# Patient Record
Sex: Female | Born: 1965 | Race: White | Hispanic: Yes | Marital: Married | Smoking: Former smoker
Health system: Southern US, Community
[De-identification: ages and names within clinical notes are randomized; demographics above are authoritative.]

## PROBLEM LIST (undated history)

## (undated) DIAGNOSIS — E669 Obesity, unspecified: Secondary | ICD-10-CM

## (undated) HISTORY — DX: Obesity, unspecified: E66.9

---

## 2005-09-01 ENCOUNTER — Other Ambulatory Visit: Admission: RE | Admit: 2005-09-01 | Discharge: 2005-09-01 | Payer: Self-pay | Admitting: Obstetrics and Gynecology

## 2007-03-02 ENCOUNTER — Emergency Department (HOSPITAL_COMMUNITY): Admission: EM | Admit: 2007-03-02 | Discharge: 2007-03-02 | Payer: Self-pay | Admitting: Emergency Medicine

## 2007-03-05 ENCOUNTER — Emergency Department (HOSPITAL_COMMUNITY): Admission: EM | Admit: 2007-03-05 | Discharge: 2007-03-05 | Payer: Self-pay | Admitting: Emergency Medicine

## 2007-03-09 ENCOUNTER — Encounter (HOSPITAL_COMMUNITY): Admission: RE | Admit: 2007-03-09 | Discharge: 2007-06-07 | Payer: Self-pay | Admitting: Emergency Medicine

## 2009-10-06 ENCOUNTER — Encounter: Admission: RE | Admit: 2009-10-06 | Discharge: 2009-10-06 | Payer: Self-pay | Admitting: Obstetrics and Gynecology

## 2009-12-24 ENCOUNTER — Encounter: Admission: RE | Admit: 2009-12-24 | Discharge: 2009-12-24 | Payer: Self-pay | Admitting: Obstetrics and Gynecology

## 2010-04-29 ENCOUNTER — Encounter: Admission: RE | Admit: 2010-04-29 | Discharge: 2010-04-29 | Payer: Self-pay | Admitting: Family Medicine

## 2010-08-02 ENCOUNTER — Encounter: Payer: Self-pay | Admitting: Obstetrics and Gynecology

## 2011-01-01 ENCOUNTER — Other Ambulatory Visit: Payer: Self-pay | Admitting: Obstetrics and Gynecology

## 2011-01-06 ENCOUNTER — Other Ambulatory Visit: Payer: Self-pay | Admitting: Obstetrics and Gynecology

## 2011-01-06 DIAGNOSIS — R928 Other abnormal and inconclusive findings on diagnostic imaging of breast: Secondary | ICD-10-CM

## 2011-01-12 ENCOUNTER — Ambulatory Visit
Admission: RE | Admit: 2011-01-12 | Discharge: 2011-01-12 | Disposition: A | Discharge status: Home / Self Care | Payer: BC Managed Care – PPO | Source: Ambulatory Visit | Attending: Obstetrics and Gynecology | Admitting: Obstetrics and Gynecology

## 2011-01-12 DIAGNOSIS — R928 Other abnormal and inconclusive findings on diagnostic imaging of breast: Secondary | ICD-10-CM

## 2014-06-03 ENCOUNTER — Other Ambulatory Visit: Payer: Self-pay | Admitting: Family Medicine

## 2014-06-03 DIAGNOSIS — N644 Mastodynia: Secondary | ICD-10-CM

## 2014-06-19 ENCOUNTER — Ambulatory Visit
Admission: RE | Admit: 2014-06-19 | Discharge: 2014-06-19 | Disposition: A | Discharge status: Home / Self Care | Payer: BC Managed Care – PPO | Source: Ambulatory Visit | Attending: Family Medicine | Admitting: Family Medicine

## 2014-06-19 DIAGNOSIS — N644 Mastodynia: Secondary | ICD-10-CM

## 2014-07-20 ENCOUNTER — Emergency Department (HOSPITAL_COMMUNITY): Payer: BLUE CROSS/BLUE SHIELD

## 2014-07-20 ENCOUNTER — Emergency Department (HOSPITAL_COMMUNITY)
Admission: EM | Admit: 2014-07-20 | Discharge: 2014-07-20 | Disposition: A | Discharge status: Home / Self Care | Payer: BLUE CROSS/BLUE SHIELD | Attending: Emergency Medicine | Admitting: Emergency Medicine

## 2014-07-20 ENCOUNTER — Encounter (HOSPITAL_COMMUNITY): Payer: Self-pay | Admitting: Emergency Medicine

## 2014-07-20 DIAGNOSIS — Z3202 Encounter for pregnancy test, result negative: Secondary | ICD-10-CM | POA: Diagnosis not present

## 2014-07-20 DIAGNOSIS — Y998 Other external cause status: Secondary | ICD-10-CM | POA: Insufficient documentation

## 2014-07-20 DIAGNOSIS — Y9301 Activity, walking, marching and hiking: Secondary | ICD-10-CM | POA: Diagnosis not present

## 2014-07-20 DIAGNOSIS — S79912A Unspecified injury of left hip, initial encounter: Secondary | ICD-10-CM | POA: Insufficient documentation

## 2014-07-20 DIAGNOSIS — S6992XA Unspecified injury of left wrist, hand and finger(s), initial encounter: Secondary | ICD-10-CM | POA: Diagnosis not present

## 2014-07-20 DIAGNOSIS — Z87891 Personal history of nicotine dependence: Secondary | ICD-10-CM | POA: Diagnosis not present

## 2014-07-20 DIAGNOSIS — W010XXA Fall on same level from slipping, tripping and stumbling without subsequent striking against object, initial encounter: Secondary | ICD-10-CM | POA: Diagnosis not present

## 2014-07-20 DIAGNOSIS — Z79899 Other long term (current) drug therapy: Secondary | ICD-10-CM | POA: Insufficient documentation

## 2014-07-20 DIAGNOSIS — Y9259 Other trade areas as the place of occurrence of the external cause: Secondary | ICD-10-CM | POA: Diagnosis not present

## 2014-07-20 DIAGNOSIS — Z880 Allergy status to penicillin: Secondary | ICD-10-CM | POA: Diagnosis not present

## 2014-07-20 DIAGNOSIS — W19XXXA Unspecified fall, initial encounter: Secondary | ICD-10-CM

## 2014-07-20 LAB — POC URINE PREG, ED: PREG TEST UR: NEGATIVE

## 2014-07-20 MED ORDER — IBUPROFEN 800 MG PO TABS: 800.0000 mg | ORAL_TABLET | Freq: Three times a day (TID) | ORAL | Status: AC

## 2014-07-20 MED ORDER — HYDROCODONE-ACETAMINOPHEN 5-325 MG PO TABS: 1.0000 | ORAL_TABLET | Freq: Four times a day (QID) | ORAL | Status: DC | PRN

## 2014-07-20 MED ORDER — HYDROMORPHONE HCL 1 MG/ML IJ SOLN
1.0000 mg | Freq: Once | INTRAMUSCULAR | Status: AC
  Administered 2014-07-20: 1 mg via INTRAMUSCULAR
  Filled 2014-07-20: qty 1

## 2014-07-20 MED ORDER — KETOROLAC TROMETHAMINE 30 MG/ML IJ SOLN
30.0000 mg | Freq: Once | INTRAMUSCULAR | Status: AC
  Administered 2014-07-20: 30 mg via INTRAMUSCULAR
  Filled 2014-07-20: qty 1

## 2014-07-20 NOTE — ED Notes (Signed)
Ortho called 

## 2014-07-20 NOTE — ED Notes (Signed)
Pt A&Ox4. All spinal and cervical precautions in place. KED and C-collar in place. No gross hip shortening noted. Able to move all extremities. Has not had any pain medication today. Awaiting  MD.

## 2014-07-20 NOTE — ED Notes (Signed)
Bed: WA21 Expected date: 07/20/14 Expected time: 2:39 PM Means of arrival: Ambulance Comments: Fall/hip pain, back pain

## 2014-07-20 NOTE — ED Notes (Signed)
Patient stood up and when she put pressure on her left leg, she stated " it feels like I have a charlie horse in my leg".

## 2014-07-20 NOTE — ED Notes (Signed)
Per EMS-walking through garage and slipped on wet concrete. Fell on left hip. Bilateral lumbar back pain becoming excruciating when attempting to move left leg. Rating pain 7/10 and 10/10 with movement. Also c/o left pinky finger deformity and pain. Daughter (sports trainer) "splinted" finger and wrapped in gauze before EMS arrival. A&Ox4. VS: BP 142/82 HR 80 Regular RR 18. Skin PWD.

## 2014-07-20 NOTE — ED Notes (Signed)
MD at bedside. 

## 2014-07-20 NOTE — ED Notes (Signed)
AVS explained in detail. Knows not to drink/drive with prescribed medications. Knows to follow up with orthopedics in 3 days for hand assessment. No other questions/concerns.

## 2014-07-20 NOTE — ED Notes (Signed)
Patient transported to X-ray 

## 2014-07-20 NOTE — ED Provider Notes (Signed)
CSN: [id number]     Arrival date & time 07/20/14  1440 History   First MD Initiated Contact with Patient 07/20/14 1518     Chief Complaint  Patient presents with  . Fall  . Hip Pain  . Leg Pain     HPI  Patient presents after mechanical fall with pain in her left posterior hip, left fifth digit. Patient was awake and alert, before the fall, slipped, fell onto her backside. Since that time she has not been ambulatory. She has no pain in her head, neck, chest, belly. No incontinence, or loss of sensation in any extremity. No medication taken this for for relief. The patient's daughter put her hand in a splint after the event.   History reviewed. No pertinent past medical history. History reviewed. No pertinent past surgical history. History reviewed. No pertinent family history. History  Substance Use Topics  . Smoking status: Former Games developer  . Smokeless tobacco: Not on file  . Alcohol Use: Not on file     Comment: occasionally    OB History    No data available     Review of Systems  Constitutional:       Per HPI, otherwise negative  HENT:       Per HPI, otherwise negative  Respiratory:       Per HPI, otherwise negative  Cardiovascular:       Per HPI, otherwise negative  Gastrointestinal: Negative for nausea and vomiting.  Endocrine:       Negative aside from HPI  Genitourinary:       Neg aside from HPI   Musculoskeletal:       Per HPI, otherwise negative  Skin: Negative for wound.  Neurological: Negative for dizziness, syncope, weakness and headaches.      Allergies  Penicillins  Home Medications   Prior to Admission medications   Medication Sig Start Date End Date Taking? Authorizing Provider  Multiple Vitamins-Minerals (MULTIVITAMIN & MINERAL PO) Take 1 tablet by mouth daily.   Yes Historical Provider, MD   BP 143/73 mmHg  Pulse 86  Temp(Src) 98.4 F (36.9 C) (Oral)  Resp 16  SpO2 97%  LMP 06/16/2014 Physical Exam  Constitutional: She is  oriented to person, place, and time. She appears well-developed and well-nourished. No distress.  HENT:  Head: Normocephalic and atraumatic.  Eyes: Conjunctivae and EOM are normal.  Neck: Trachea normal. No spinous process tenderness and no muscular tenderness present. No rigidity. No edema, no erythema and normal range of motion present.  Cardiovascular: Normal rate and regular rhythm.   Pulmonary/Chest: Effort normal and breath sounds normal. No stridor. No respiratory distress.  Abdominal: She exhibits no distension.  Musculoskeletal: She exhibits no edema.       Left elbow: Normal.       Right wrist: Normal.       Left wrist: Normal.       Right hip: Normal.       Left hip: She exhibits decreased range of motion, tenderness and bony tenderness. She exhibits normal strength, no swelling, no crepitus and no deformity.       Right knee: Normal.       Left knee: Normal.       Right ankle: Normal.       Left ankle: Normal.       Arms: Neurological: She is alert and oriented to person, place, and time. No cranial nerve deficit.  Skin: Skin is warm and dry.  Psychiatric: She has a  normal mood and affect.  Nursing note and vitals reviewed.   ED Course  Procedures (including critical care time) Labs Review Labs Reviewed  POC URINE PREG, ED    Imaging Review Ct Pelvis Wo Contrast  07/20/2014   CLINICAL DATA:  Unable to bear weight. More pain on the left. Patient status post fall.Fall today at garage, pt states that she slid her foot and fell onto her left hip; most pain at left hip, the pain radiates to left knee  EXAM: CT PELVIS WITHOUT CONTRAST  TECHNIQUE: Multidetector CT imaging of the pelvis was performed following the standard protocol without intravenous contrast.  COMPARISON:  Radiograph 07/20/2014  FINDINGS: No evidence of left right hip fracture. No evidence of pelvic fracture or sacral fracture. No pelvic hematoma.  Limited view of the soft tissues demonstrates normal uterus and  bladder. Several diverticula sigmoid colon. No inflammation  IMPRESSION: 1. No evidence of left or right hip fracture. 2. Sigmoid diverticulosis.   Electronically Signed   By: Genevive BiStewart  Edmunds M.D.   On: 07/20/2014 18:49   Dg Finger Little Left  07/20/2014   CLINICAL DATA:  Fall today at garage, pt states that she slid her foot and fell onto her left hip; most pain at left hip, the pain radiates to left knee; pain in left pinky; no previous injury to finger, nor to the hip  EXAM: LEFT LITTLE FINGER 2+V  COMPARISON:  None.  FINDINGS: There is no evidence of fracture or dislocation. There is flexion of the DIP joint. There is no evidence of arthropathy or other focal bone abnormality. Soft tissues are unremarkable.  IMPRESSION: 1. No acute osseous injury of the left fifth digit. 2. There is collection at the left fifth DIP joint on the lateral view which may be voluntary versus secondary to extensor tendon injury.   Electronically Signed   By: Elige KoHetal  Patel   On: 07/20/2014 17:09   Dg Hip Unilat With Pelvis 2-3 Views Left  07/20/2014   CLINICAL DATA:  Fall, left hip pain  EXAM: DG HIP W/ PELVIS 2-3V*L*  COMPARISON:  None.  FINDINGS: No fracture or dislocation is seen.  Bilateral hip joint spaces are symmetric.  Visualized bony pelvis appears intact.  IMPRESSION: No fracture or dislocation is seen.   Electronically Signed   By: Charline BillsSriyesh  Krishnan M.D.   On: 07/20/2014 17:10   Update: Following return from x-ray, patient continues to have some pain. Attempts to have the patient stand were unsuccessful. She will receive both pain medication and CT scan.   Update: On return from CT scan, patient can stand, bear weight, but has some difficulty ambulating. However, she remains awake and alert, with no other complaints. The fifth digit has been splinted.   MDM   Final diagnoses:  Fall     This patient presents after mechanical fall with pain in her left hip, left fifth digit. Left fifth digit injury is  consistent with disruption of the extensor tendon. Patient had splinting, which was well tolerated. Patient's hip pain was evaluated with x-ray, CT, and the patient was able to bear weight following these procedures, and provision of analgesia. Patient was appropriate for discharge with close outpatient follow-up.  Gerhard Munchobert Lu Paradise, MD 07/20/14 2013

## 2014-07-20 NOTE — ED Notes (Signed)
Patient transported to CT 

## 2014-07-20 NOTE — Discharge Instructions (Signed)
As discussed, it is important to monitor your condition carefully.  It is normal to feel particularly sore in the days following a fall.  However, if you develop new, or concerning changes, be sure to return here for further evaluation and management.  In regards to your hand injury, it is important to call Monday to schedule appropriate follow-up care.

## 2014-10-15 ENCOUNTER — Encounter: Payer: Self-pay | Admitting: Internal Medicine

## 2014-12-04 ENCOUNTER — Encounter: Payer: Self-pay | Admitting: Internal Medicine

## 2014-12-04 ENCOUNTER — Ambulatory Visit (INDEPENDENT_AMBULATORY_CARE_PROVIDER_SITE_OTHER): Payer: Self-pay | Admitting: Internal Medicine

## 2014-12-04 VITALS — BP 118/82 | Ht 62.0 in | Wt 199.0 lb

## 2014-12-04 DIAGNOSIS — Z1211 Encounter for screening for malignant neoplasm of colon: Secondary | ICD-10-CM

## 2014-12-04 NOTE — Patient Instructions (Addendum)
You have been scheduled for a colonoscopy. Please follow written instructions given to you at your visit today.  Please pick up your prep supplies at the pharmacy within the next 1-3 days. If you use inhalers (even only as needed), please bring them with you on the day of your procedure.    I appreciate the opportunity to care for you. Stan Headarl Gessner, M.D.. FACG

## 2014-12-06 ENCOUNTER — Encounter: Payer: Self-pay | Admitting: Internal Medicine

## 2014-12-06 NOTE — Progress Notes (Signed)
   Subjective:    Patient ID: Marie Matthews, female    DOB: [birth year], 49 y.o.   MRN: [id number] Cc: colon cancer screening HPI This very nice woman is here to discuss crca screening. She is interested in doing a colonoscopy. No active sxs.  Medications, allergies, past medical history, past surgical history, family history and social history are reviewed and updated in the EMR.  Review of Systems All other ROS negative, except using phentermine for weight loss    Objective:   Physical Exam BP 118/82 mmHg  Ht 5\' 2"  (1.575 m)  Wt 199 lb (90.266 kg)  BMI 36.39 kg/m2  LMP 11/26/2014 @BP  118/82 mmHg  Ht 5\' 2"  (1.575 m)  Wt 199 lb (90.266 kg)  BMI 36.39 kg/m2  LMP 11/26/2014@  General:  NAD Eyes:   anicteric Lungs:  clear Heart:: S1S2 no rubs, murmurs or gallops      Assessment & Plan:  Colon cancer screening - Plan: Ambulatory referral to Gastroenterology  Schedule screening colonoscopy. The risks and benefits as well as alternatives of endoscopic procedure(s) have been discussed and reviewed. All questions answered. The patient agrees to proceed.

## 2015-03-03 ENCOUNTER — Ambulatory Visit (AMBULATORY_SURGERY_CENTER): Payer: BLUE CROSS/BLUE SHIELD | Admitting: Internal Medicine

## 2015-03-03 ENCOUNTER — Encounter: Payer: Self-pay | Admitting: Internal Medicine

## 2015-03-03 VITALS — BP 127/69 | HR 52 | Temp 97.3°F | Resp 16 | Ht 62.0 in | Wt 199.0 lb

## 2015-03-03 DIAGNOSIS — Z1211 Encounter for screening for malignant neoplasm of colon: Secondary | ICD-10-CM | POA: Diagnosis not present

## 2015-03-03 MED ORDER — SODIUM CHLORIDE 0.9 % IV SOLN: 500.0000 mL | INTRAVENOUS | Status: DC

## 2015-03-03 NOTE — Progress Notes (Signed)
To recovery, report to Brown, RN, VSS. 

## 2015-03-03 NOTE — Patient Instructions (Addendum)
No polyps or cancer detected!  You do have a condition called diverticulosis - common and not usually a problem. Please read the handout provided.  Next routine colon screening in 10 years - 2026  I appreciate the opportunity to care for you. Iva Boop, MD, FACG      YOU HAD AN ENDOSCOPIC PROCEDURE TODAY AT THE [facility]:   Refer to the procedure report that was given to you for any specific questions about what was found during the examination.  If the procedure report does not answer your questions, please call your gastroenterologist to clarify.  If you requested that your care partner not be given the details of your procedure findings, then the procedure report has been included in a sealed envelope for you to review at your convenience later.  YOU SHOULD EXPECT: Some feelings of bloating in the abdomen. Passage of more gas than usual.  Walking can help get rid of the air that was put into your GI tract during the procedure and reduce the bloating. If you had a lower endoscopy (such as a colonoscopy or flexible sigmoidoscopy) you may notice spotting of blood in your stool or on the toilet paper. If you underwent a bowel prep for your procedure, you may not have a normal bowel movement for a few days.  Please Note:  You might notice some irritation and congestion in your nose or some drainage.  This is from the oxygen used during your procedure.  There is no need for concern and it should clear up in a day or so.  SYMPTOMS TO REPORT IMMEDIATELY:   Following lower endoscopy (colonoscopy or flexible sigmoidoscopy):  Excessive amounts of blood in the stool  Significant tenderness or worsening of abdominal pains  Swelling of the abdomen that is new, acute  Fever of 100F or higher    For urgent or emergent issues, a gastroenterologist can be reached at any hour by calling [phone].   DIET: Your first meal following the procedure should be a small  meal and then it is ok to progress to your normal diet. Heavy or fried foods are harder to digest and may make you feel nauseous or bloated.  Likewise, meals heavy in dairy and vegetables can increase bloating.  Drink plenty of fluids but you should avoid alcoholic beverages for 24 hours.  ACTIVITY:  You should plan to take it easy for the rest of today and you should NOT DRIVE or use heavy machinery until tomorrow (because of the sedation medicines used during the test).    FOLLOW UP: Our staff will call the number listed on your records the next business day following your procedure to check on you and address any questions or concerns that you may have regarding the information given to you following your procedure. If we do not reach you, we will leave a message.  However, if you are feeling well and you are not experiencing any problems, there is no need to return our call.  We will assume that you have returned to your regular daily activities without incident.  If any biopsies were taken you will be contacted by phone or by letter within the next 1-3 weeks.  Please call us at [phone] if you have not heard about the biopsies in 3 weeks.    SIGNATURES/CONFIDENTIALITY: You and/or your care partner have signed paperwork which will be entered into your electronic medical record.  These signatures attest to the fact that  that the information above on your After Visit Summary has been reviewed and is understood.  Full responsibility of the confidentiality of this discharge information lies with you and/or your care-partner.   Resume medications. Information given on diverticulosis.

## 2015-03-03 NOTE — Op Note (Signed)
 [facility] [address] [place], [address]   COLONOSCOPY PROCEDURE REPORT  PATIENT: Marie Matthews, Marie Matthews  MR#: [id number] BIRTHDATE: 12-Apr-1966 , 48  yrs. old GENDER: female ENDOSCOPIST: Iva Boop, MD, [facility] PROCEDURE DATE:  03/03/2015 PROCEDURE:   Colonoscopy, screening First Screening Colonoscopy - Avg.  risk and is 50 yrs.  old or older Yes.  Prior Negative Screening - Now for repeat screening. N/A  History of Adenoma - Now for follow-up colonoscopy & has been > or = to 3 yrs.  N/A  Polyps removed today? No Recommend repeat exam, <10 yrs? No ASA CLASS:   Class II INDICATIONS:Screening for colonic neoplasia and Colorectal Neoplasm Risk Assessment for this procedure is average risk. MEDICATIONS: Propofol 200 mg IV and Monitored anesthesia care  DESCRIPTION OF PROCEDURE:   After the risks benefits and alternatives of the procedure were thoroughly explained, informed consent was obtained.  The digital rectal exam revealed no abnormalities of the rectum.   The LB [id number]  endoscope was introduced through the anus and advanced to the cecum, which was identified by both the appendix and ileocecal valve. No adverse events experienced.   The quality of the prep was good.  (MiraLax was used)  The instrument was then slowly withdrawn as the colon was fully examined. Estimated blood loss is zero unless otherwise noted in this procedure report.      COLON FINDINGS: There was diverticulosis noted throughout the entire examined colon.   The examination was otherwise normal.   Right colon retroflexion included.  Retroflexed views revealed no abnormalities. The time to cecum = 2.0 Withdrawal time = 10.0   The scope was withdrawn and the procedure completed. COMPLICATIONS: There were no immediate complications.  ENDOSCOPIC IMPRESSION: 1.   Diverticulosis was noted throughout the entire examined colon 2.   The examination was otherwise normal - good prep -  first screen  RECOMMENDATIONS: Repeat colon screening in 2026 -  10 years.  eSigned:  Iva Boop, MD, [facility] 03/03/2015 8:58 AM   cc: Antony Haste, MD and The Patient

## 2015-03-04 ENCOUNTER — Telehealth: Payer: Self-pay | Admitting: *Deleted

## 2015-03-04 NOTE — Telephone Encounter (Signed)
  Follow up Call-  Call back number 03/03/2015  Post procedure Call Back phone  # [phone]  Permission to leave phone message Yes     Patient questions:  Left message to call us if necessary.

## 2017-12-21 ENCOUNTER — Other Ambulatory Visit: Payer: Self-pay | Admitting: Family Medicine

## 2017-12-21 DIAGNOSIS — Z1231 Encounter for screening mammogram for malignant neoplasm of breast: Secondary | ICD-10-CM

## 2018-01-10 ENCOUNTER — Ambulatory Visit: Payer: BLUE CROSS/BLUE SHIELD

## 2018-01-16 ENCOUNTER — Ambulatory Visit
Admission: RE | Admit: 2018-01-16 | Discharge: 2018-01-16 | Disposition: A | Discharge status: Home / Self Care | Payer: BLUE CROSS/BLUE SHIELD | Source: Ambulatory Visit | Attending: Family Medicine | Admitting: Family Medicine

## 2018-01-16 DIAGNOSIS — Z1231 Encounter for screening mammogram for malignant neoplasm of breast: Secondary | ICD-10-CM

## 2020-07-09 ENCOUNTER — Other Ambulatory Visit: Payer: Self-pay | Admitting: Family Medicine

## 2020-07-09 DIAGNOSIS — Z1231 Encounter for screening mammogram for malignant neoplasm of breast: Secondary | ICD-10-CM

## 2020-07-16 ENCOUNTER — Other Ambulatory Visit: Payer: Self-pay

## 2020-07-16 ENCOUNTER — Ambulatory Visit
Admission: RE | Admit: 2020-07-16 | Discharge: 2020-07-16 | Disposition: A | Discharge status: Home / Self Care | Payer: 59 | Source: Ambulatory Visit

## 2020-07-16 DIAGNOSIS — Z1231 Encounter for screening mammogram for malignant neoplasm of breast: Secondary | ICD-10-CM

## 2020-07-17 ENCOUNTER — Other Ambulatory Visit: Payer: Self-pay | Admitting: Family Medicine

## 2020-07-17 DIAGNOSIS — R928 Other abnormal and inconclusive findings on diagnostic imaging of breast: Secondary | ICD-10-CM

## 2020-07-31 ENCOUNTER — Other Ambulatory Visit: Payer: Self-pay | Admitting: Family Medicine

## 2020-07-31 ENCOUNTER — Ambulatory Visit
Admission: RE | Admit: 2020-07-31 | Discharge: 2020-07-31 | Disposition: A | Discharge status: Home / Self Care | Payer: 59 | Source: Ambulatory Visit | Attending: Family Medicine | Admitting: Family Medicine

## 2020-07-31 ENCOUNTER — Other Ambulatory Visit: Payer: Self-pay

## 2020-07-31 DIAGNOSIS — R928 Other abnormal and inconclusive findings on diagnostic imaging of breast: Secondary | ICD-10-CM

## 2020-09-29 ENCOUNTER — Other Ambulatory Visit: Payer: 59

## 2020-09-29 ENCOUNTER — Other Ambulatory Visit: Payer: Self-pay

## 2020-09-29 ENCOUNTER — Ambulatory Visit
Admission: RE | Admit: 2020-09-29 | Discharge: 2020-09-29 | Disposition: A | Discharge status: Home / Self Care | Payer: 59 | Source: Ambulatory Visit | Attending: Family Medicine | Admitting: Family Medicine

## 2020-09-29 ENCOUNTER — Other Ambulatory Visit: Payer: Self-pay | Admitting: Family Medicine

## 2020-09-29 DIAGNOSIS — R928 Other abnormal and inconclusive findings on diagnostic imaging of breast: Secondary | ICD-10-CM

## 2020-10-03 ENCOUNTER — Other Ambulatory Visit: Payer: 59

## 2020-10-07 ENCOUNTER — Other Ambulatory Visit: Payer: Self-pay

## 2020-10-07 ENCOUNTER — Ambulatory Visit
Admission: RE | Admit: 2020-10-07 | Discharge: 2020-10-07 | Disposition: A | Discharge status: Home / Self Care | Payer: 59 | Source: Ambulatory Visit | Attending: Family Medicine | Admitting: Family Medicine

## 2020-10-07 DIAGNOSIS — R928 Other abnormal and inconclusive findings on diagnostic imaging of breast: Secondary | ICD-10-CM

## 2020-10-07 HISTORY — PX: BREAST BIOPSY: SHX20

## 2020-10-08 LAB — SURGICAL PATHOLOGY

## 2021-03-06 IMAGING — MG DIGITAL SCREENING BILAT W/ TOMO W/ CAD
8 series · 8 of 24 positions shown · non-contrast
Comparison: Previous exam(s).

CLINICAL DATA: Screening.

EXAM:
DIGITAL SCREENING BILATERAL MAMMOGRAM WITH TOMO AND CAD

[L CC synth-2D]
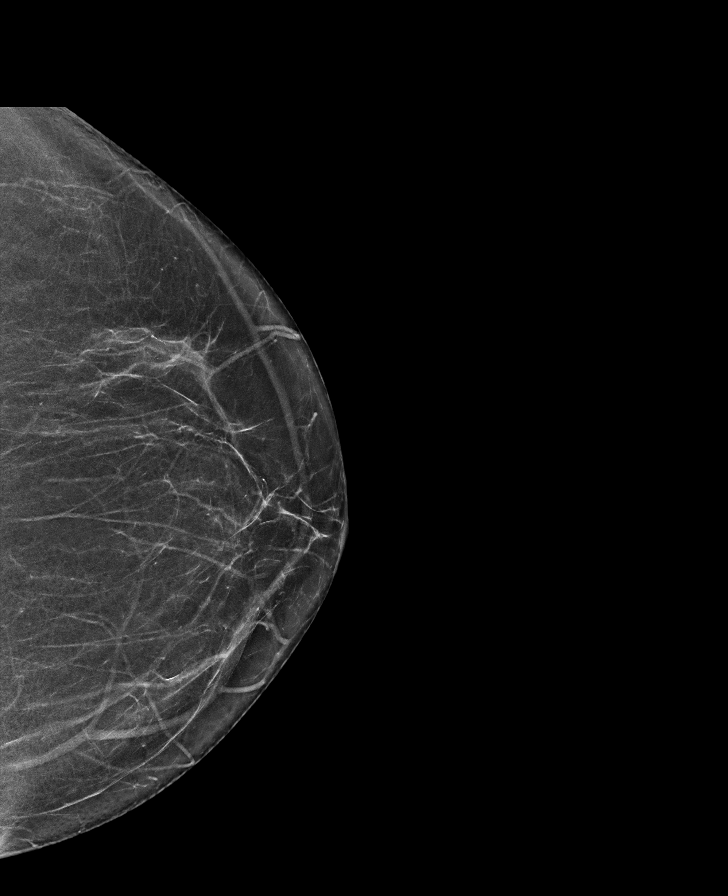

[L MLO synth-2D]
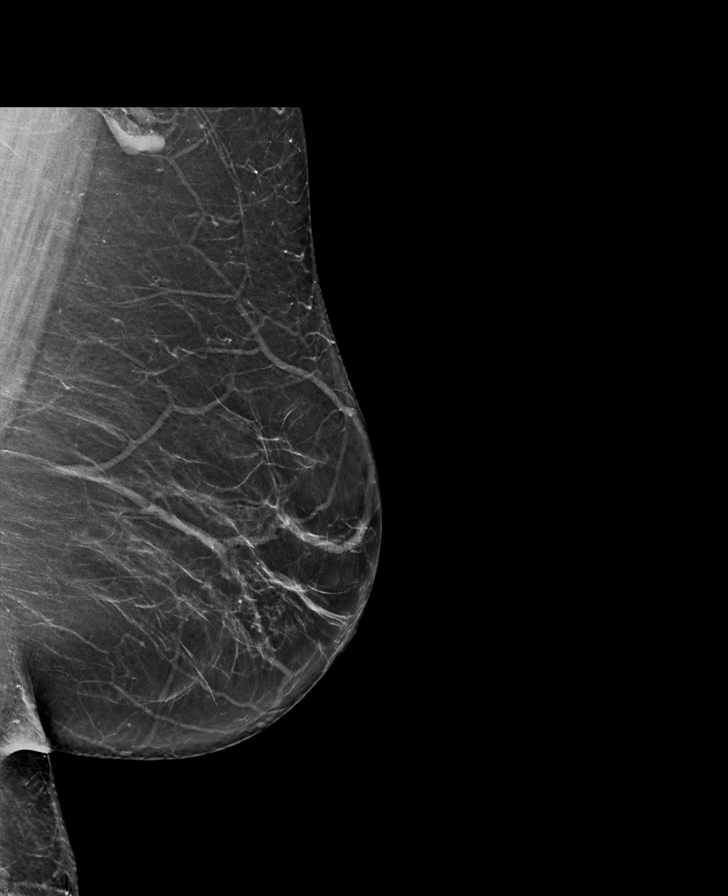

[R MLO synth-2D]
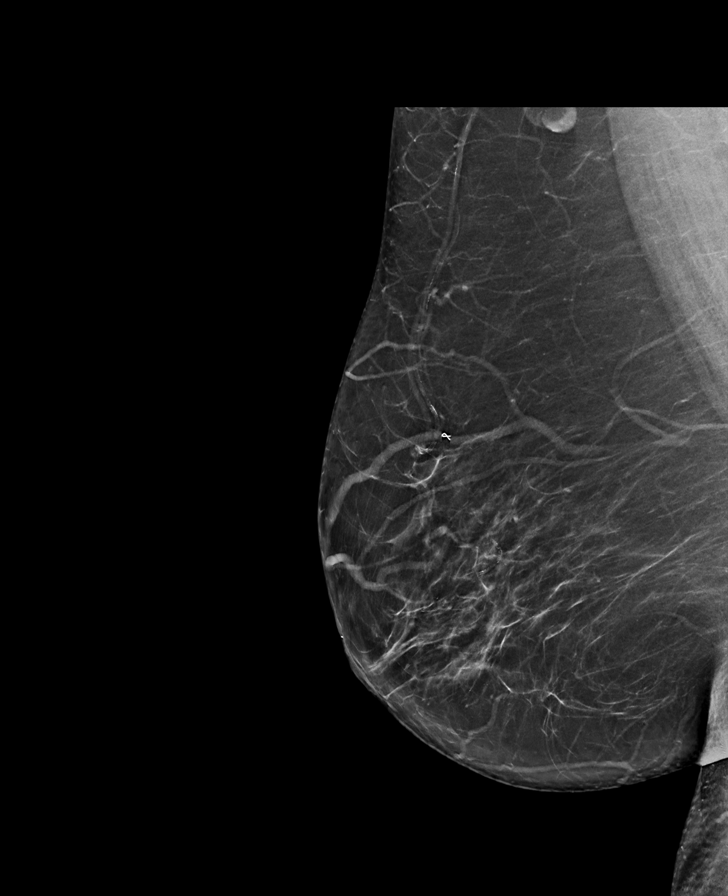

[R CC synth-2D]
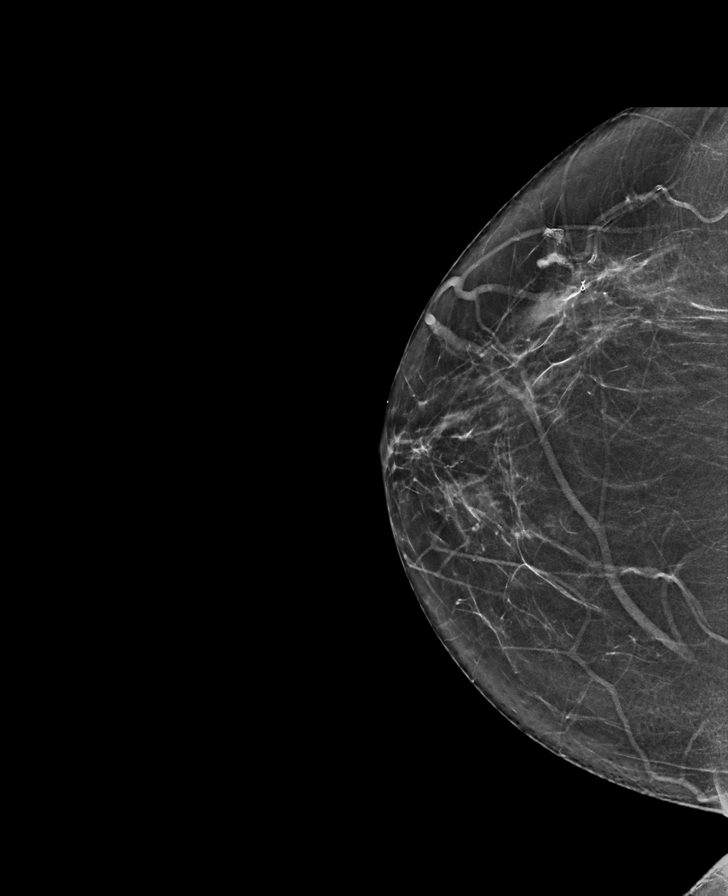

[R MLO tomo · tomo slice 39/76.0]
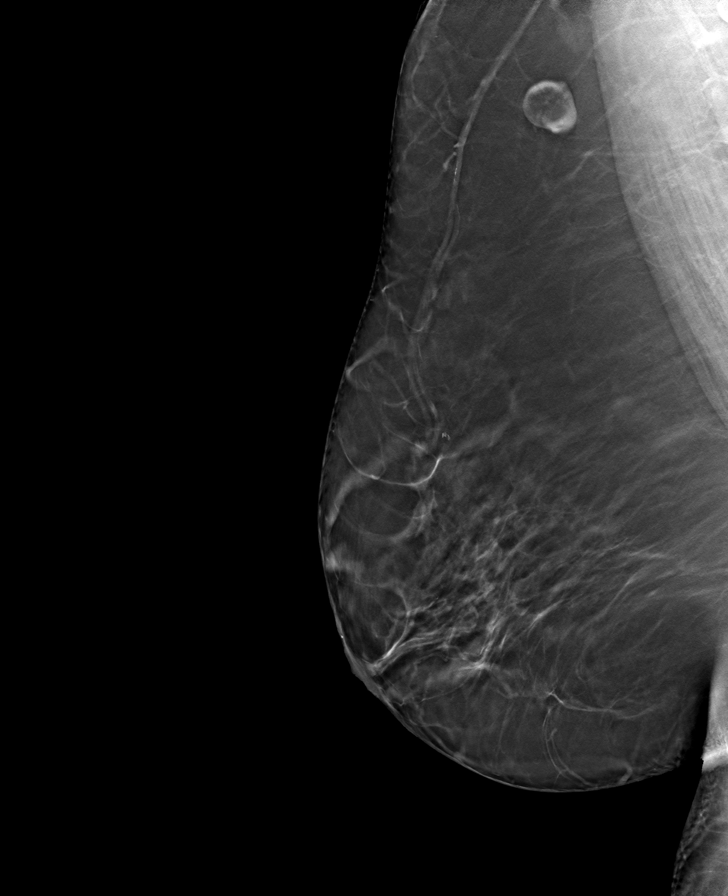

[R CC tomo · tomo slice 33/65.0]
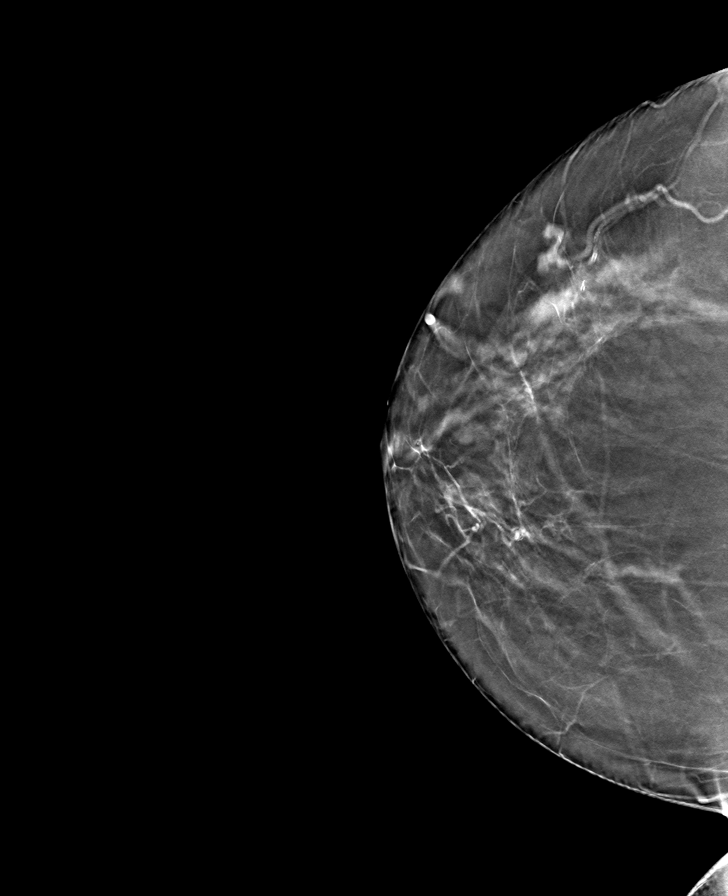

[L MLO tomo · tomo slice 38/75.0]
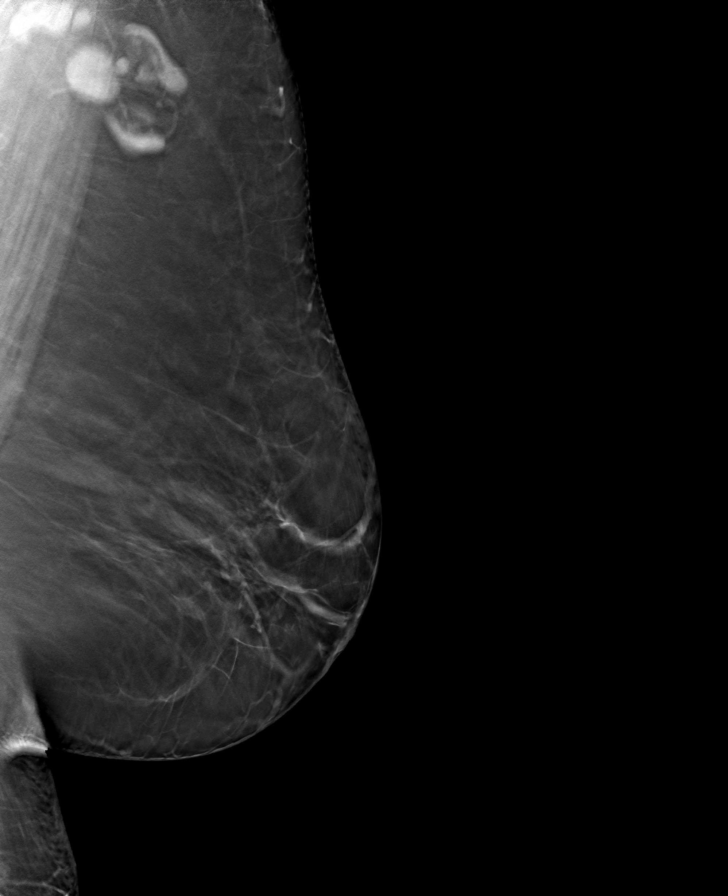

[L CC tomo · tomo slice 33/66.0]
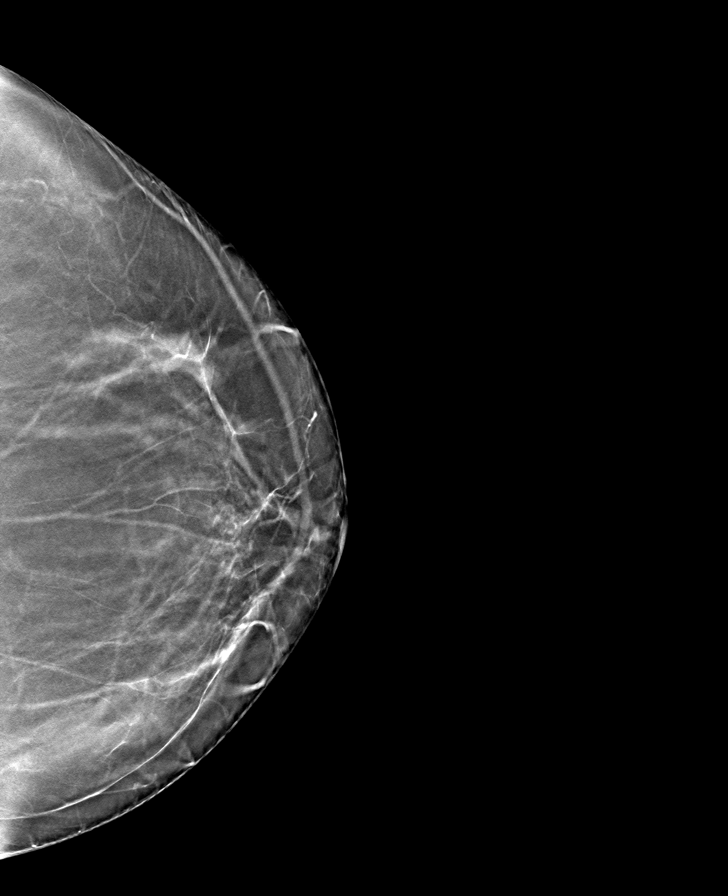

[8 of 24 positions shown; findings below may reference images not displayed]

ACR Breast Density Category b: There are scattered areas of
fibroglandular density.
FINDINGS: In the left axilla, a possible mass warrants further evaluation. In
the right breast, no findings suspicious for malignancy.

Images were processed with CAD.
IMPRESSION: Further evaluation is suggested for possible mass in the left
axilla.

RECOMMENDATION:
Ultrasound of the left axilla. (Code:12-T-22I)

The patient will be contacted regarding the findings, and additional
imaging will be scheduled.

BI-RADS CATEGORY  0: Incomplete. Need additional imaging evaluation
and/or prior mammograms for comparison.

## 2021-03-21 IMAGING — US US AXILLARY LEFT
1 series · 14 of 20 positions shown · non-contrast
Comparison: None.

CLINICAL DATA: Recall from screening mammography with
tomosynthesis, possible enlarged LEFT axillary lymph nodes. The LEFT
breast was normal in appearance mammographically.

EXAM:
ULTRASOUND OF THE LEFT AXILLA

[Series 1: us axillary left · 0.08mm/px · 14 of 20 slices shown]
[im 1/20]
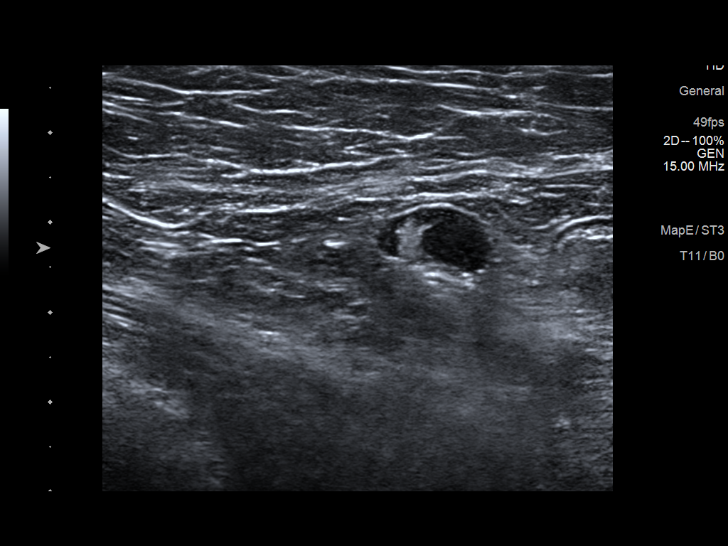
[im 3/20]
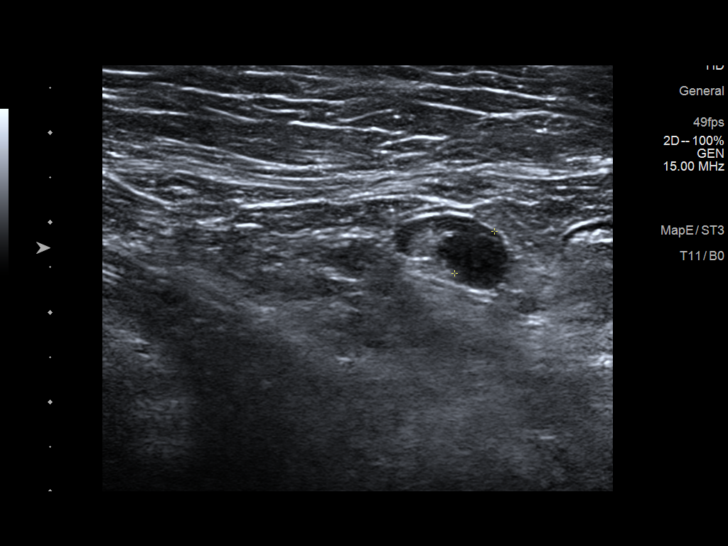
[im 4/20]
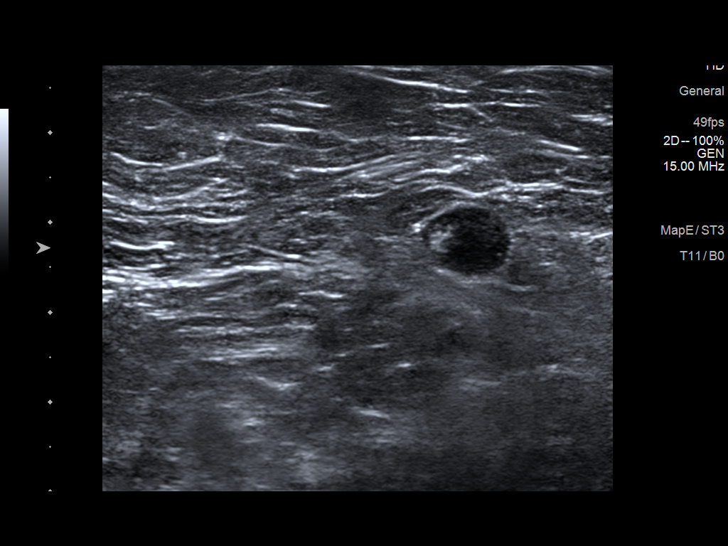
[im 6/20]
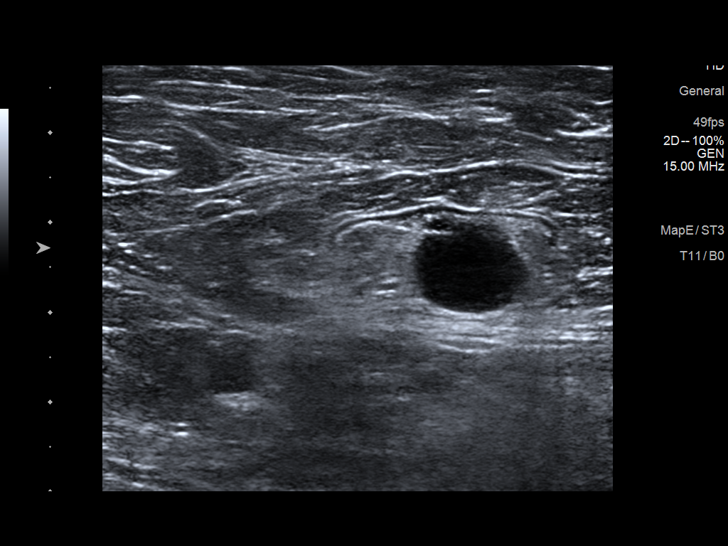
[im 7/20]
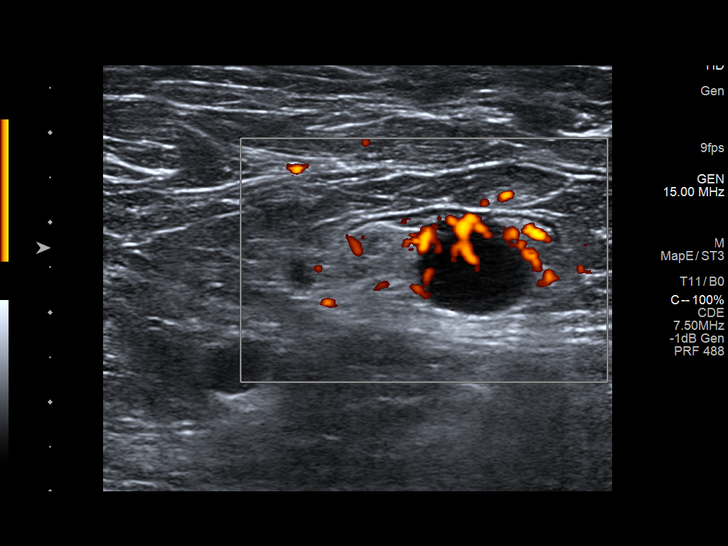
[im 8/20]
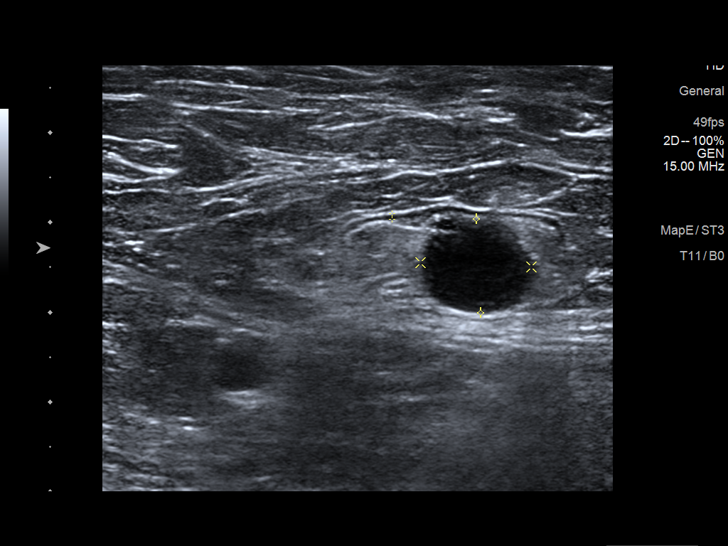
[im 10/20]
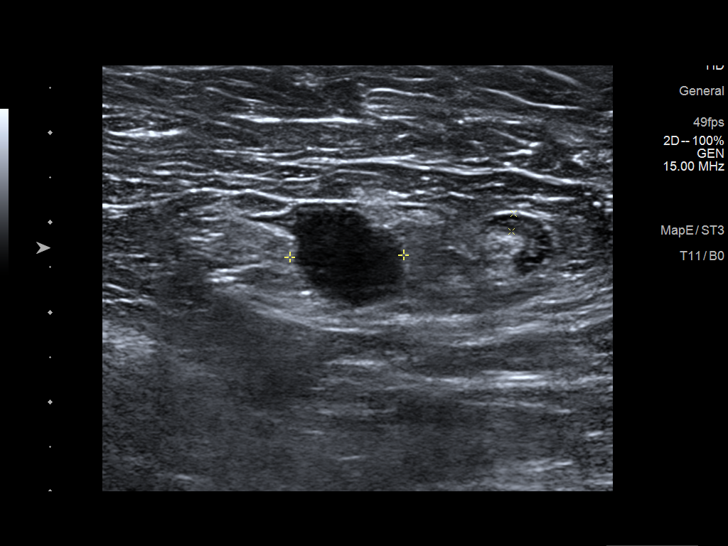
[im 11/20]
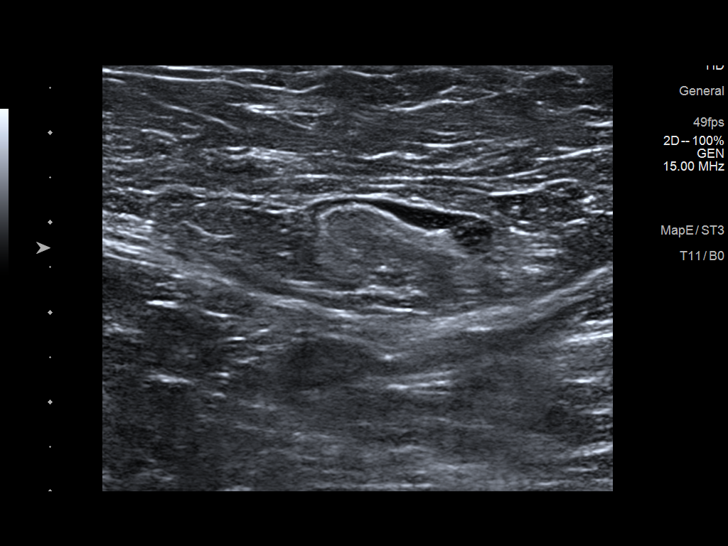
[im 13/20]
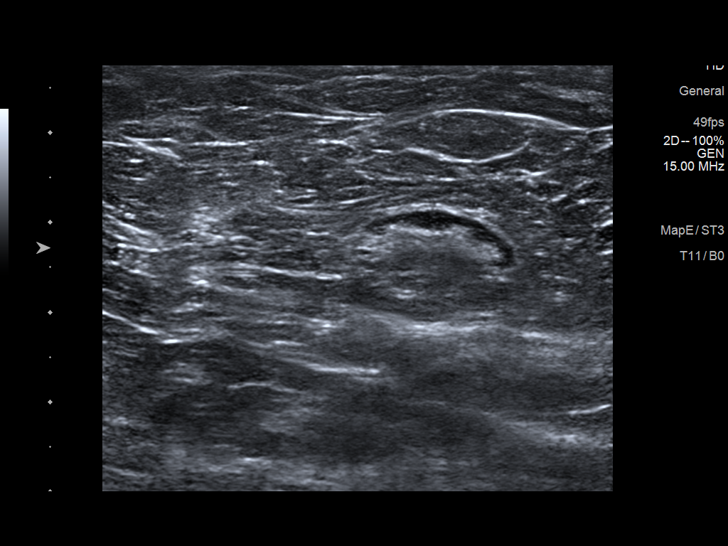
[im 14/20]
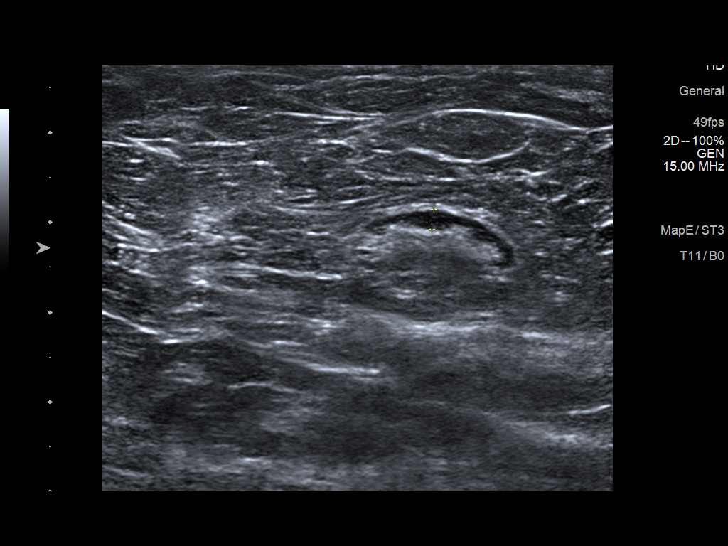
[im 16/20]
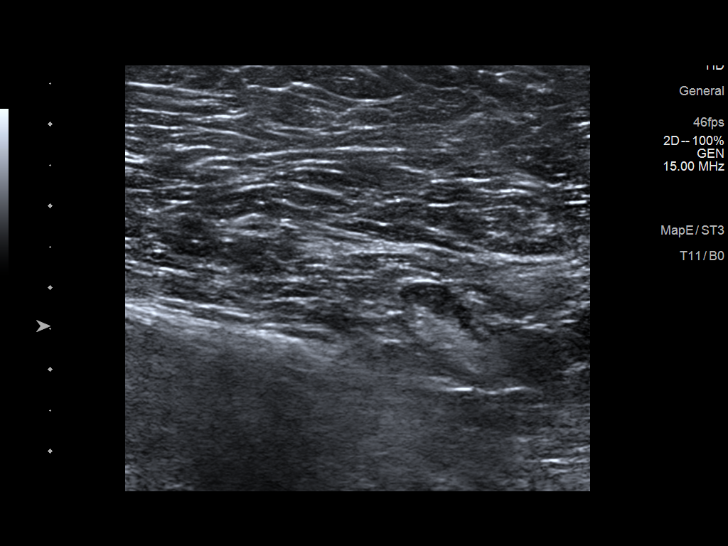
[im 17/20]
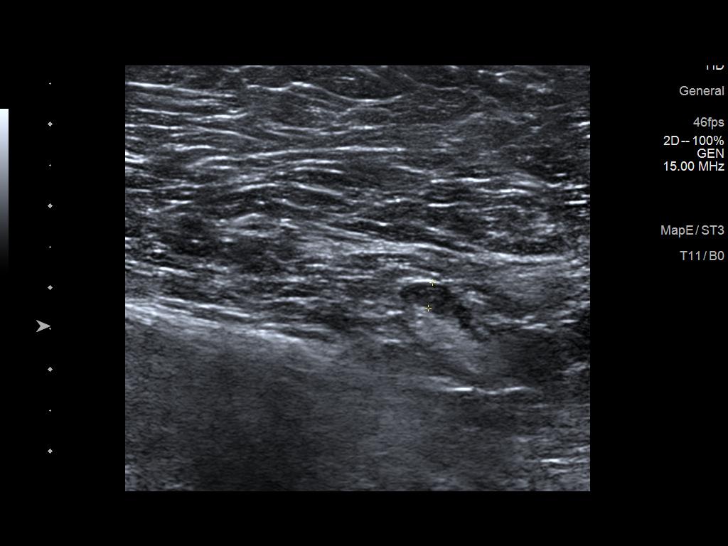
[im 18/20]
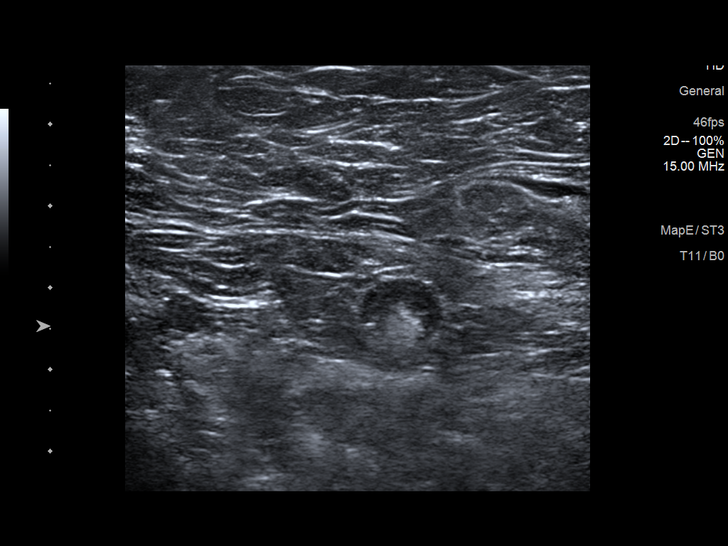
[im 20/20]
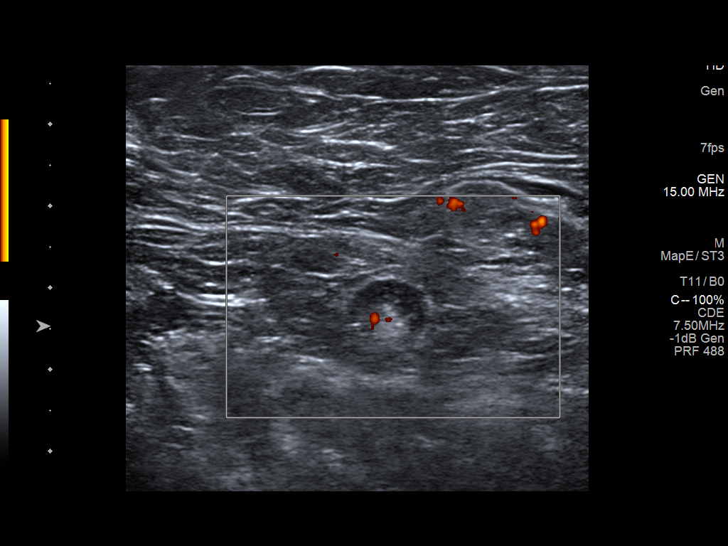

[14 of 20 positions shown; findings below may reference images not displayed]

FINDINGS: On correlative physical exam, there is no palpable LEFT axillary
lymphadenopathy.

Ultrasound is performed, showing 2 adjacent mildly abnormal lymph
nodes. A lymph node with focal cortical thickening up to 6 mm is
identified. An adjacent lymph node with focal cortical thickening up
to 12 mm is also identified. The remaining lymph nodes demonstrate
normal morphology with normal cortical thickness.
IMPRESSION: 2 adjacent likely benign reactive lymph nodes demonstrating focal
cortical thickening in the LEFT axilla.

RECOMMENDATION:
Follow-up LEFT axillary ultrasound in 2 months. If the lymph nodes
have not decreased in size on the follow-up examination, then biopsy
of the larger node would be recommended.

I have discussed the findings and recommendations with the patient.
If applicable, a reminder letter will be sent to the patient
regarding the next appointment.

BI-RADS CATEGORY  3: Probably benign.

## 2021-05-20 IMAGING — US US AXILLARY LEFT
1 series · 13 of 13 positions shown · non-contrast
Comparison: Previous exam(s).

CLINICAL DATA: 54-year-old female for 2 month follow-up of enlarged
LEFT axillary lymph nodes.

EXAM:
ULTRASOUND OF THE LEFT AXILLA

[Series 1: us axillary left · 0.08mm/px · 13 of 13 slices shown]
[im 1/13]
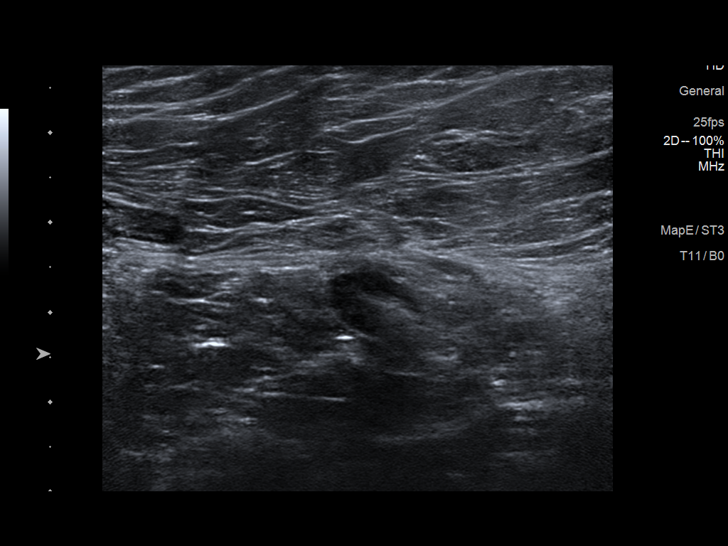
[im 2/13]
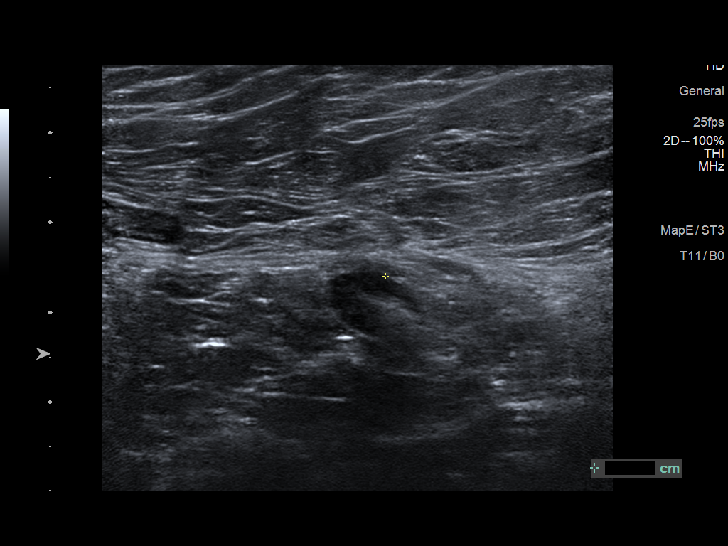
[im 3/13]
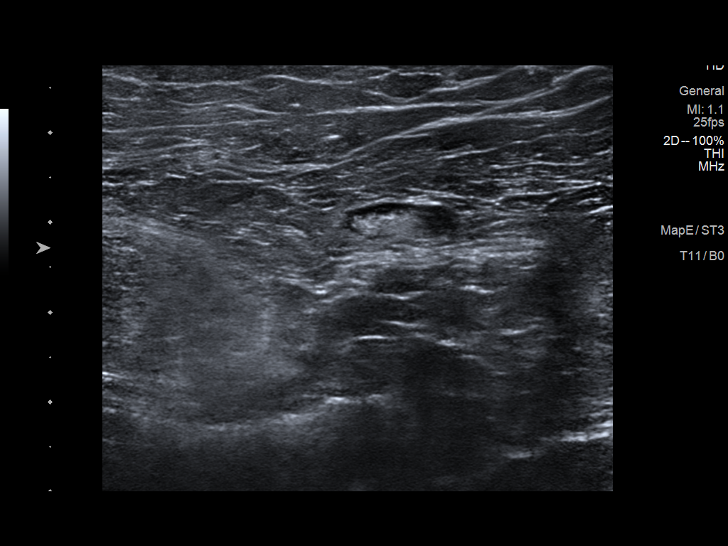
[im 4/13]
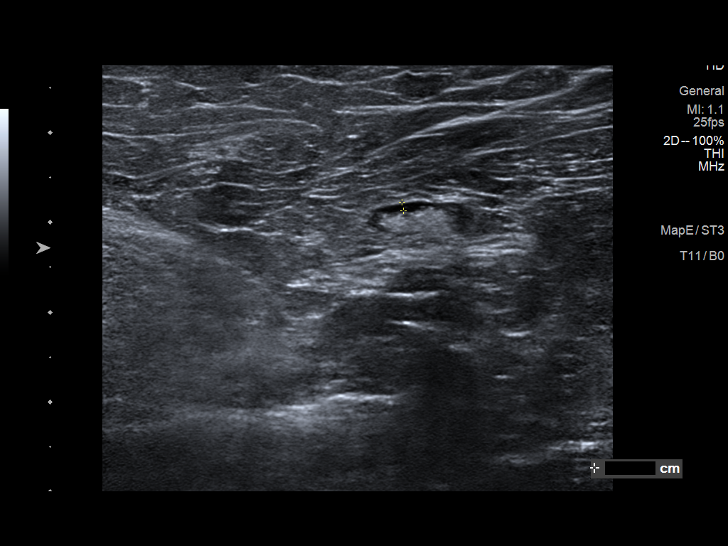
[im 5/13]
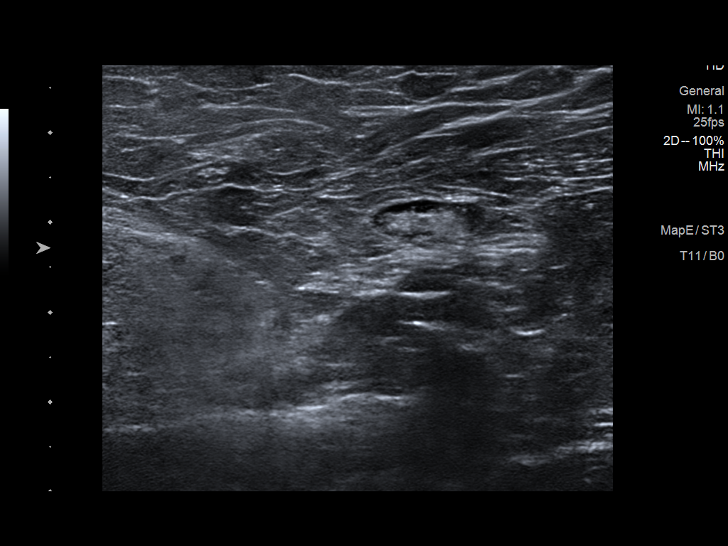
[im 6/13]
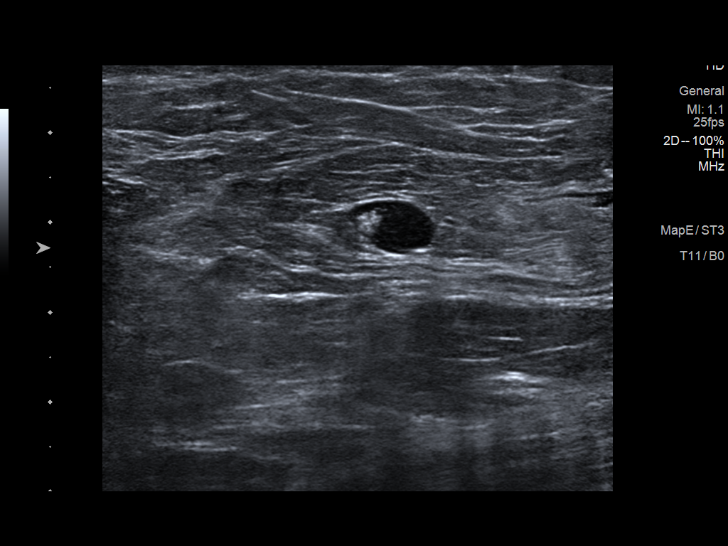
[im 7/13]
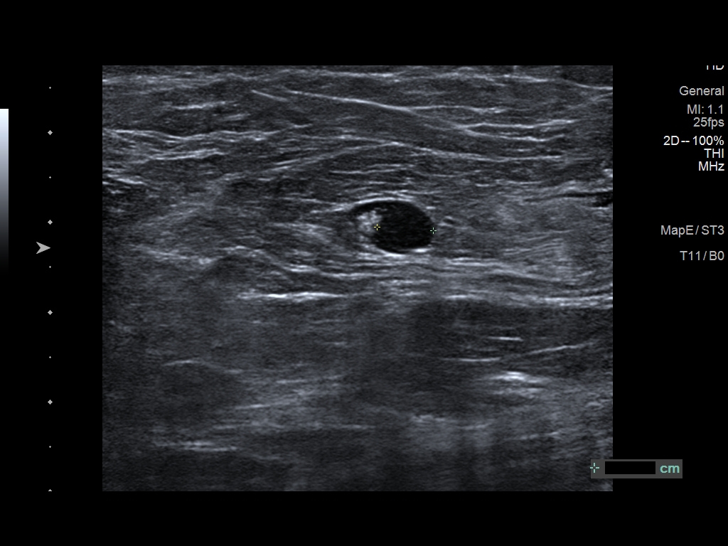
[im 8/13]
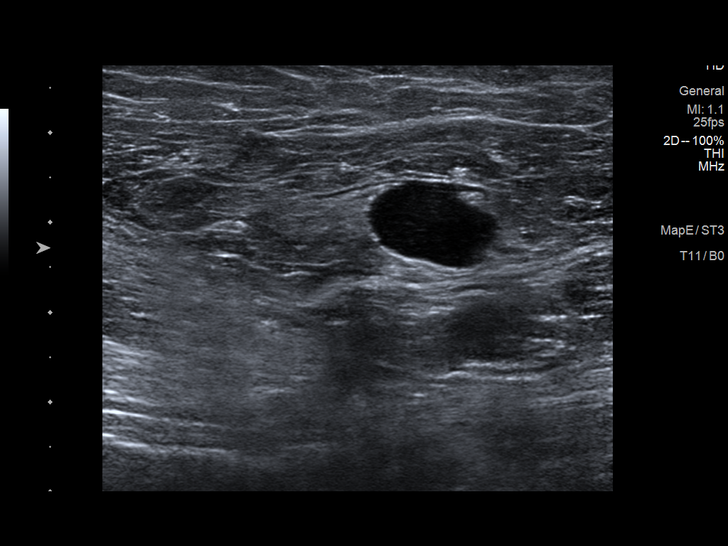
[im 9/13]
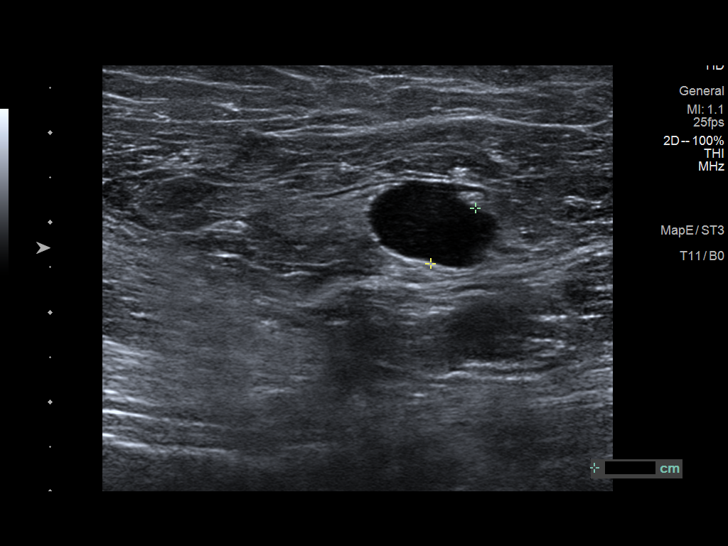
[im 10/13]
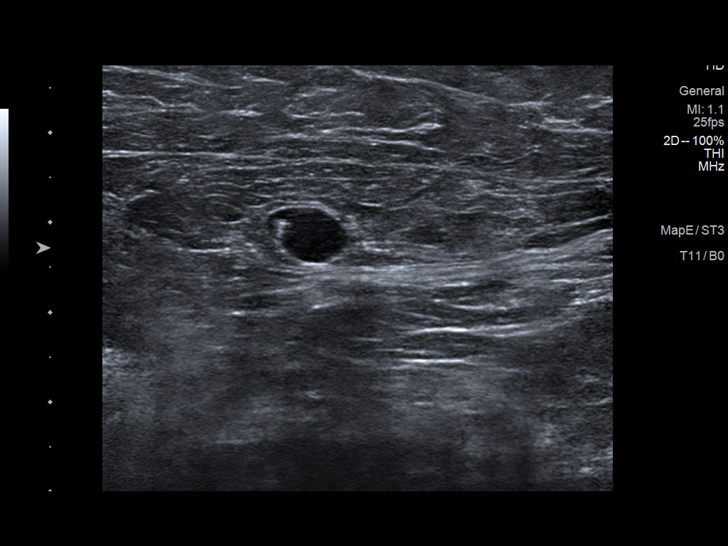
[im 11/13]
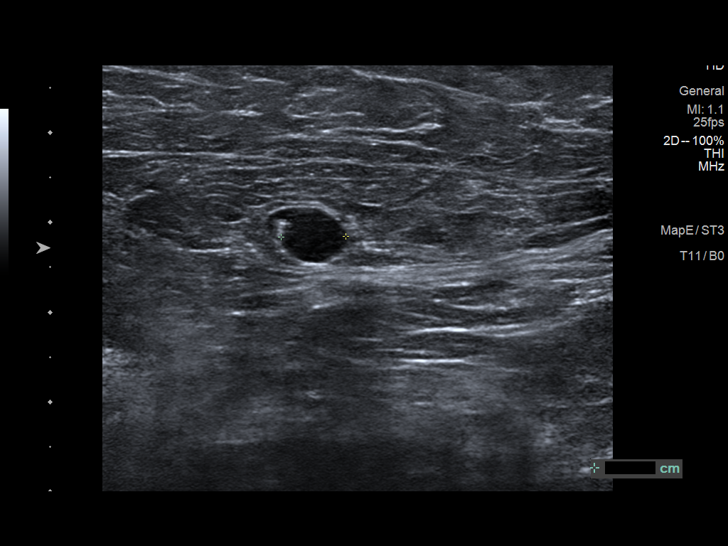
[im 12/13]
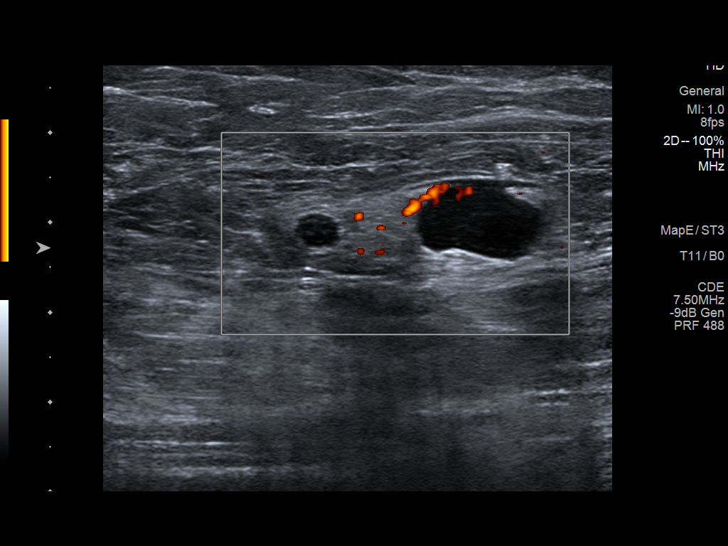
[im 13/13]
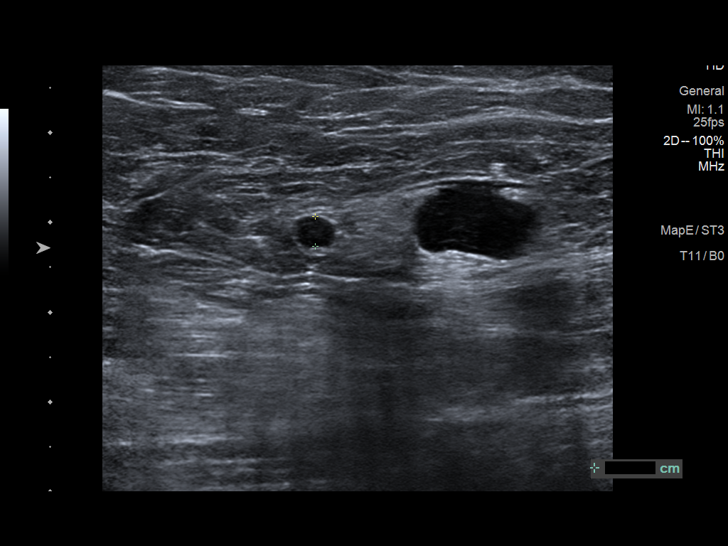

[13 of 13 positions shown; findings below may reference images not displayed]

FINDINGS: Ultrasound is performed, showing no significant change of 2 LEFT
axillary lymph nodes with focal cortical thickening of up to 12 mm.
IMPRESSION: No significant change of 2 LEFT axillary lymph node with focal
cortical thickening. Tissue sampling is recommended.

RECOMMENDATION:
Ultrasound-guided biopsy of 1 of the abnormal LEFT axillary lymph
nodes.

I have discussed the findings and recommendations with the patient.
If applicable, a reminder letter will be sent to the patient
regarding the next appointment.

BI-RADS CATEGORY  4: Suspicious.

## 2021-05-28 IMAGING — US US AXILLARY NODE CORE BIOPSY LEFT
1 series · 12 of 16 positions shown · non-contrast
Comparison: Previous exam(s).
COMPARISON: Previous exam(s).

Addendum:
CLINICAL DATA: Patient presents for ultrasound-guided core needle
biopsy of an abnormal left axillary lymph node.

EXAM:
US AXILLARY NODE CORE BIOPSY LEFT

[Series 1: us axillary node core biopsy left · 0.07mm/px · 12 of 16 slices shown]
[im 1/16]
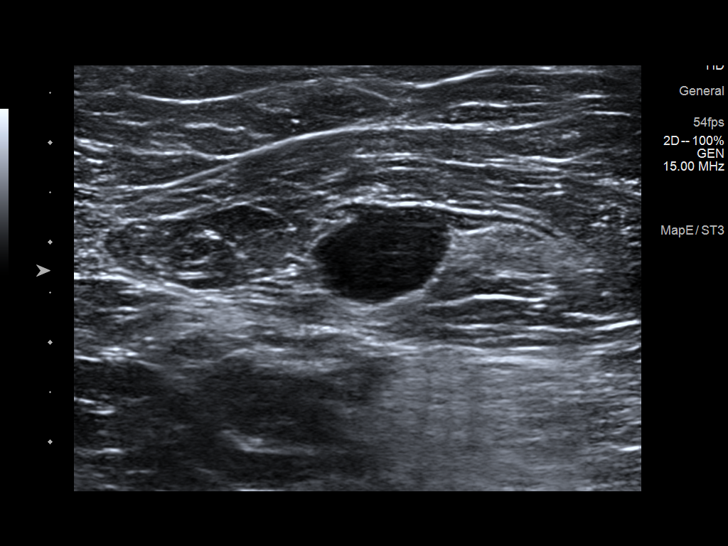
[im 3/16]
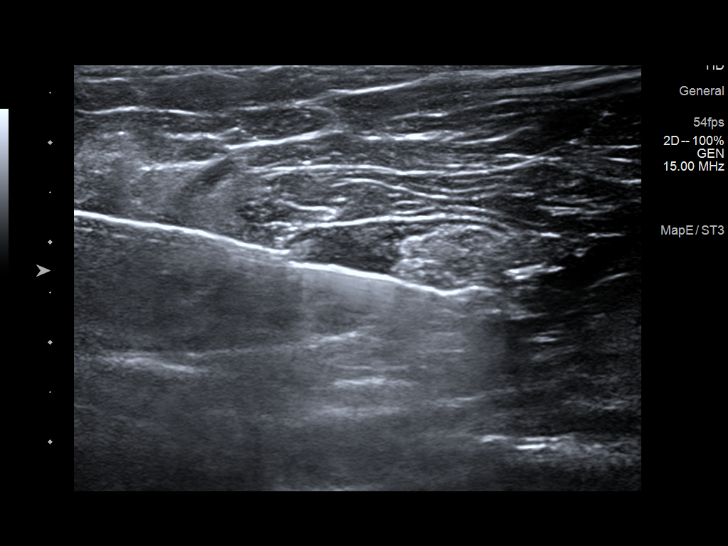
[im 4/16]
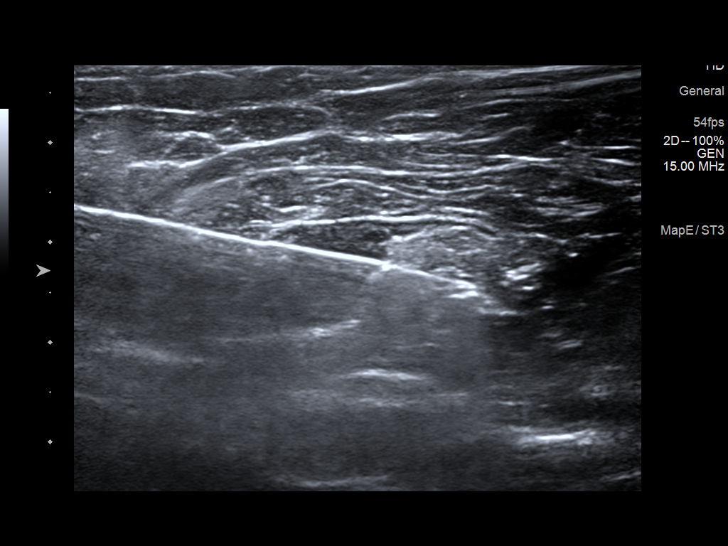
[im 5/16]
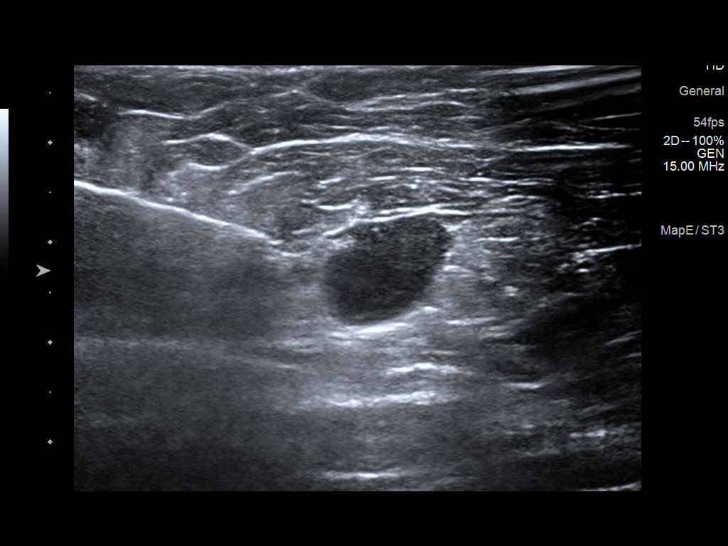
[im 7/16]
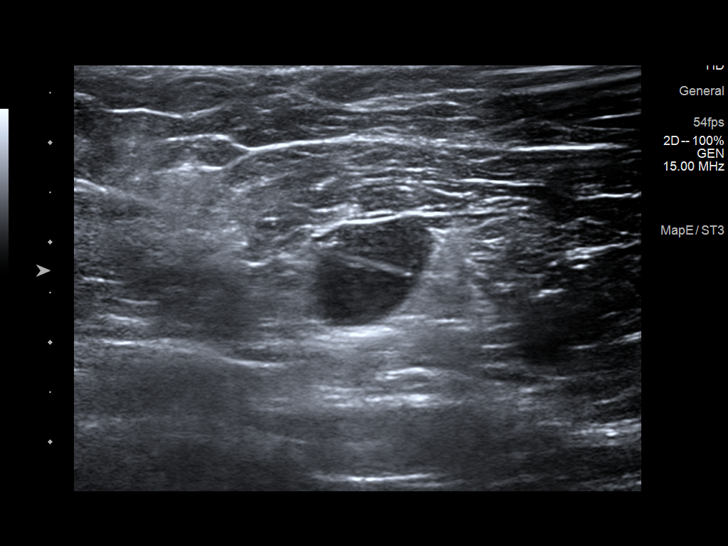
[im 8/16]
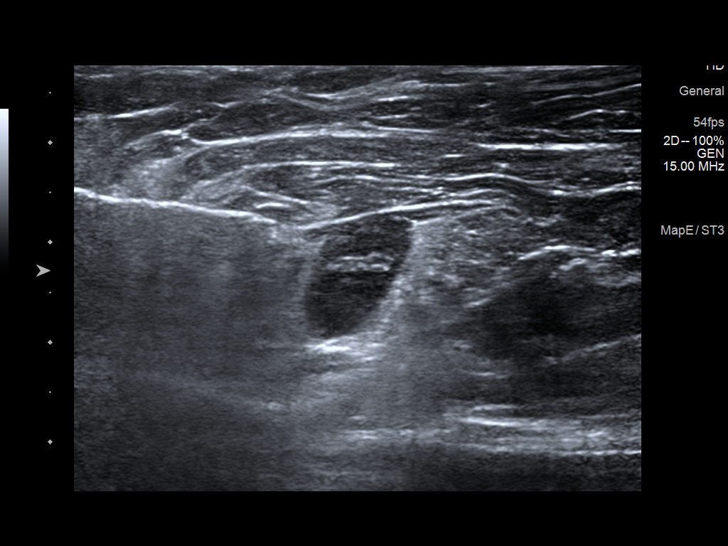
[im 9/16]
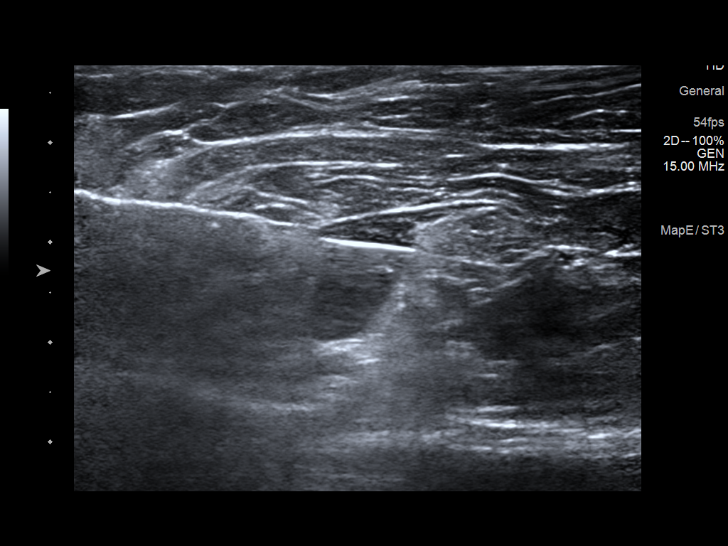
[im 11/16]
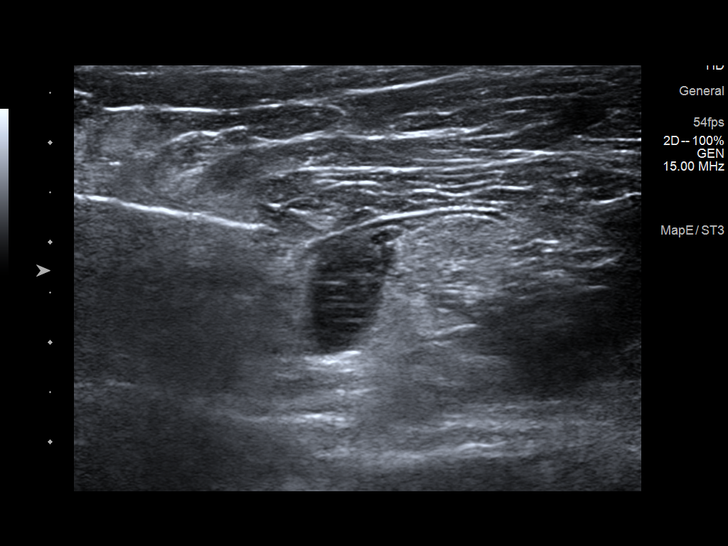
[im 12/16]
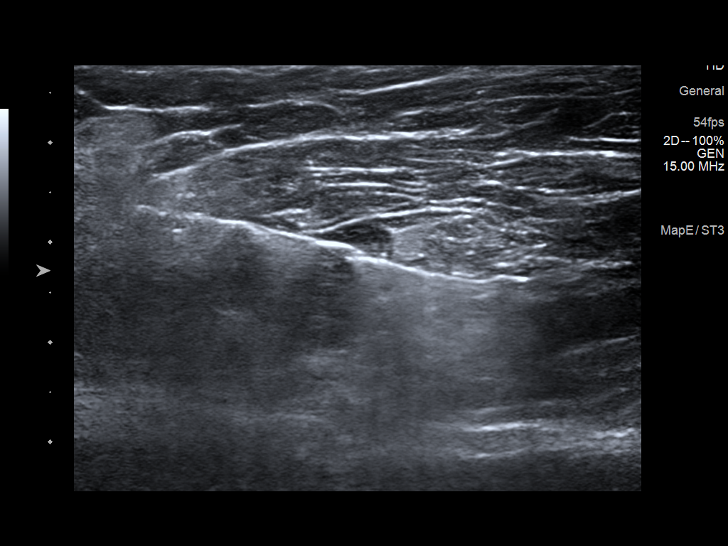
[im 13/16]
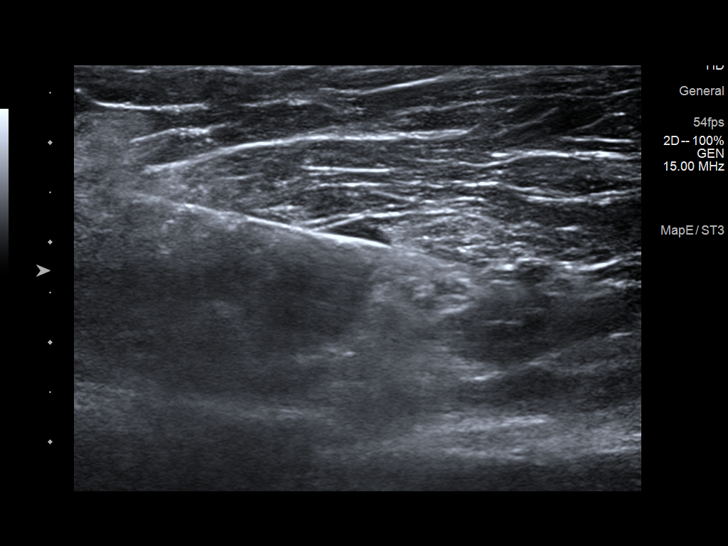
[im 15/16]
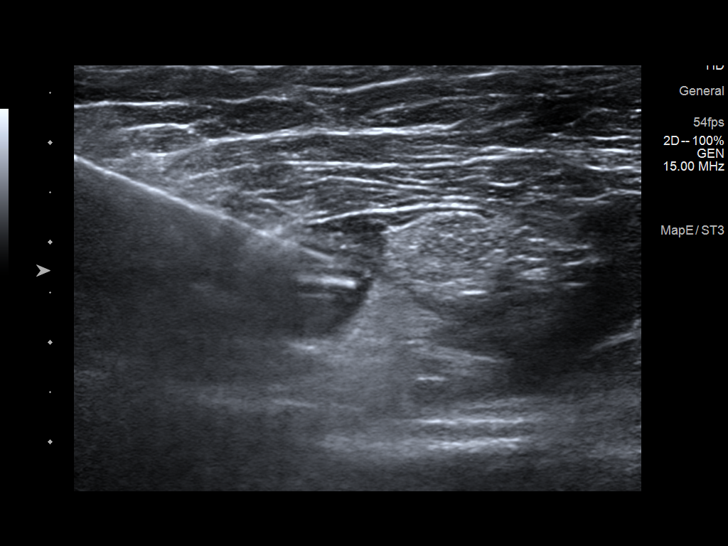
[im 16/16]
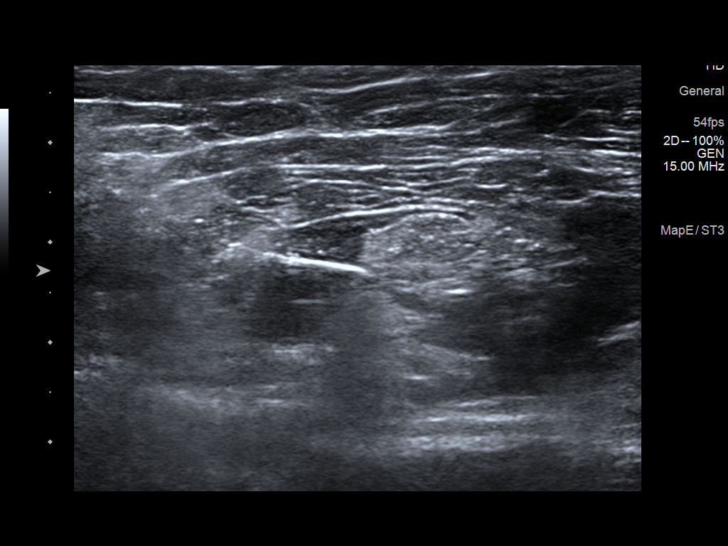

[12 of 16 positions shown; findings below may reference images not displayed]



Using sterile technique and 1% Lidocaine as local anesthetic, under
direct ultrasound visualization, a 14 gauge Muller device was
used to perform biopsy of the abnormal lymph node with a cortical
thickness of 12 mm and no visible hilar fat using an inferior
approach. At the conclusion of the procedure Tribell tissue marker
clip was deployed into the biopsy cavity. Follow up 2 view mammogram
was performed and dictated separately.
IMPRESSION: Ultrasound guided biopsy of an abnormal left axillary lymph node. No
apparent complications. Specimens were sent in formalin and saline.

ADDENDUM:
Pathology revealed REACTIVE LYMPHOID HYPERPLASIA of LEFT axillary
lymph node. Note: Flow cytometric analysis failed to show any
monoclonal B-cell population or abnormal T-cell phenotype. The
overall findings are nonspecific but consistent with reactive
lymphoid hyperplasia. This was found to be concordant by Dr. Jim
Jouve.

Pathology results were discussed with the patient by telephone. The
patient reported doing well after the biopsy with tenderness at the
site. Post biopsy instructions and care were reviewed and questions
were answered. The patient was encouraged to call The [REDACTED]

The patient was instructed to return for annual screening
mammography and informed a reminder notice would be sent regarding
this appointment.

Pathology results reported by Ginna Tiger RN on 10/09/2020.



Using sterile technique and 1% Lidocaine as local anesthetic, under
direct ultrasound visualization, a 14 gauge Muller device was
used to perform biopsy of the abnormal lymph node with a cortical
thickness of 12 mm and no visible hilar fat using an inferior
approach. At the conclusion of the procedure Tribell tissue marker
clip was deployed into the biopsy cavity. Follow up 2 view mammogram
was performed and dictated separately.
IMPRESSION: Ultrasound guided biopsy of an abnormal left axillary lymph node. No
apparent complications. Specimens were sent in formalin and saline.

## 2022-03-29 ENCOUNTER — Other Ambulatory Visit: Payer: Self-pay | Admitting: Family Medicine

## 2022-03-29 DIAGNOSIS — Z1231 Encounter for screening mammogram for malignant neoplasm of breast: Secondary | ICD-10-CM

## 2022-03-31 ENCOUNTER — Ambulatory Visit
Admission: RE | Admit: 2022-03-31 | Discharge: 2022-03-31 | Disposition: A | Discharge status: Home / Self Care | Payer: 59 | Source: Ambulatory Visit

## 2022-03-31 ENCOUNTER — Encounter: Payer: Self-pay | Admitting: Radiology

## 2022-03-31 DIAGNOSIS — Z1231 Encounter for screening mammogram for malignant neoplasm of breast: Secondary | ICD-10-CM

## 2023-02-17 ENCOUNTER — Other Ambulatory Visit: Payer: Self-pay | Admitting: Family Medicine

## 2023-02-17 DIAGNOSIS — Z0000 Encounter for general adult medical examination without abnormal findings: Secondary | ICD-10-CM

## 2023-04-04 ENCOUNTER — Ambulatory Visit
Admission: RE | Admit: 2023-04-04 | Discharge: 2023-04-04 | Disposition: A | Discharge status: Home / Self Care | Payer: 59 | Source: Ambulatory Visit | Attending: Family Medicine | Admitting: Family Medicine

## 2023-04-04 DIAGNOSIS — Z0000 Encounter for general adult medical examination without abnormal findings: Secondary | ICD-10-CM

## 2024-03-16 ENCOUNTER — Encounter

## 2024-03-16 DIAGNOSIS — Z1231 Encounter for screening mammogram for malignant neoplasm of breast: Secondary | ICD-10-CM

## 2024-03-22 ENCOUNTER — Other Ambulatory Visit: Payer: Self-pay | Admitting: Family Medicine

## 2024-03-22 DIAGNOSIS — Z1231 Encounter for screening mammogram for malignant neoplasm of breast: Secondary | ICD-10-CM

## 2024-04-04 ENCOUNTER — Ambulatory Visit
Admission: RE | Admit: 2024-04-04 | Discharge: 2024-04-04 | Disposition: A | Discharge status: Home / Self Care | Source: Ambulatory Visit

## 2024-04-04 DIAGNOSIS — Z1231 Encounter for screening mammogram for malignant neoplasm of breast: Secondary | ICD-10-CM
